# Patient Record
Sex: Female | Born: 1935 | Race: White | Hispanic: No | Marital: Married
Health system: Southern US, Community
[De-identification: ages and names within clinical notes are randomized; demographics above are authoritative.]

---

## 2000-03-09 ENCOUNTER — Encounter: Admission: RE | Admit: 2000-03-09 | Discharge: 2000-03-09 | Payer: Self-pay | Admitting: Cardiology

## 2000-03-09 ENCOUNTER — Encounter: Payer: Self-pay | Admitting: Cardiology

## 2001-03-10 ENCOUNTER — Encounter: Admission: RE | Admit: 2001-03-10 | Discharge: 2001-03-10 | Payer: Self-pay | Admitting: Family Medicine

## 2001-03-10 ENCOUNTER — Encounter: Payer: Self-pay | Admitting: Family Medicine

## 2001-03-21 ENCOUNTER — Encounter: Admission: RE | Admit: 2001-03-21 | Discharge: 2001-03-21 | Payer: Self-pay | Admitting: Family Medicine

## 2001-03-21 ENCOUNTER — Encounter: Payer: Self-pay | Admitting: Family Medicine

## 2002-04-03 ENCOUNTER — Encounter: Admission: RE | Admit: 2002-04-03 | Discharge: 2002-04-03 | Payer: Self-pay | Admitting: Family Medicine

## 2002-04-03 ENCOUNTER — Encounter: Payer: Self-pay | Admitting: Family Medicine

## 2003-04-10 ENCOUNTER — Encounter: Admission: RE | Admit: 2003-04-10 | Discharge: 2003-04-10 | Payer: Self-pay | Admitting: *Deleted

## 2003-04-10 ENCOUNTER — Encounter: Payer: Self-pay | Admitting: *Deleted

## 2004-04-10 ENCOUNTER — Encounter: Admission: RE | Admit: 2004-04-10 | Discharge: 2004-04-10 | Payer: Self-pay | Admitting: *Deleted

## 2005-04-26 ENCOUNTER — Ambulatory Visit: Payer: Self-pay | Admitting: Family Medicine

## 2005-05-07 ENCOUNTER — Ambulatory Visit: Payer: Self-pay | Admitting: Family Medicine

## 2005-08-26 ENCOUNTER — Ambulatory Visit: Payer: Self-pay | Admitting: Rheumatology

## 2006-08-22 ENCOUNTER — Ambulatory Visit: Payer: Self-pay | Admitting: Internal Medicine

## 2007-08-24 ENCOUNTER — Ambulatory Visit: Payer: Self-pay | Admitting: Nurse Practitioner

## 2008-09-23 ENCOUNTER — Ambulatory Visit: Payer: Self-pay | Admitting: Nurse Practitioner

## 2009-05-05 ENCOUNTER — Ambulatory Visit: Payer: Self-pay | Admitting: Internal Medicine

## 2009-05-27 ENCOUNTER — Ambulatory Visit: Payer: Self-pay | Admitting: Internal Medicine

## 2009-06-26 ENCOUNTER — Ambulatory Visit: Payer: Self-pay | Admitting: Internal Medicine

## 2009-07-27 ENCOUNTER — Ambulatory Visit: Payer: Self-pay | Admitting: Internal Medicine

## 2009-09-29 ENCOUNTER — Ambulatory Visit: Payer: Self-pay | Admitting: Nurse Practitioner

## 2010-10-01 ENCOUNTER — Ambulatory Visit: Payer: Self-pay | Admitting: Nurse Practitioner

## 2011-02-09 ENCOUNTER — Ambulatory Visit: Payer: Self-pay | Admitting: Family Medicine

## 2011-10-04 ENCOUNTER — Ambulatory Visit: Payer: Self-pay | Admitting: Family Medicine

## 2011-12-22 ENCOUNTER — Ambulatory Visit: Payer: Self-pay | Admitting: Family Medicine

## 2012-04-21 ENCOUNTER — Ambulatory Visit: Payer: Self-pay | Admitting: Oncology

## 2012-06-14 IMAGING — US ULTRASOUND LEFT BREAST
1 series · 18 of 19 positions shown · non-contrast
Comparison: none

REASON FOR EXAM: LT BRST PAIN NIPPLE AREA
COMMENTS:

PROCEDURE:     US  - US BREAST LEFT  - October 04, 2011  [DATE]
RESULT:     Targeted ultrasound into the periareolar and retroareolar region
of the left breast shows no abnormal fluid collection or solid mass.  No
ductal dilation is evident.

[Series 1: ultrasound left breast · 18 of 19 slices shown]
[im 1/19]
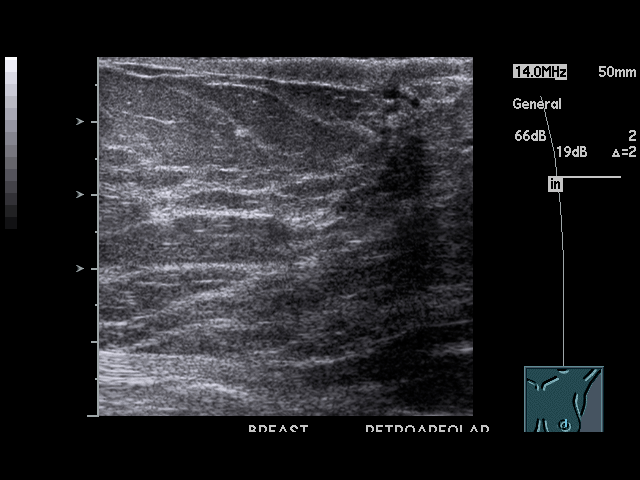
[im 2/19]
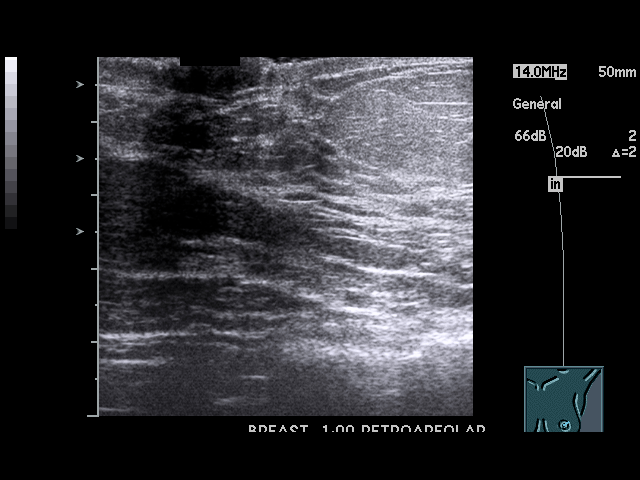
[im 3/19]
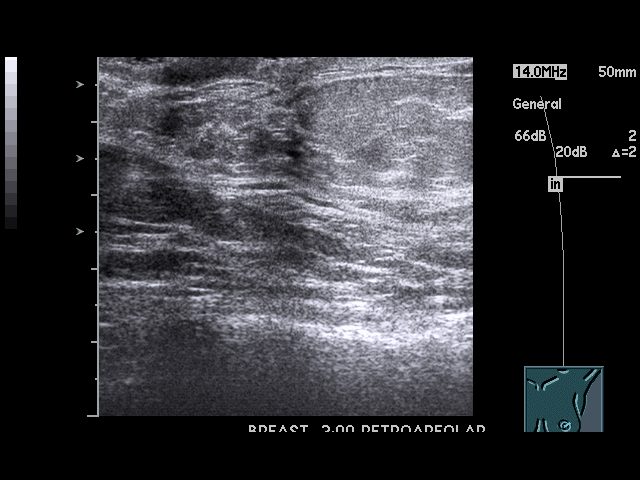
[im 4/19]
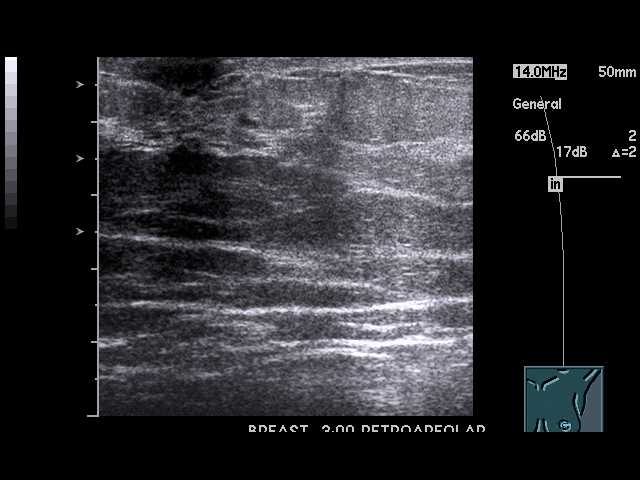
[im 5/19]
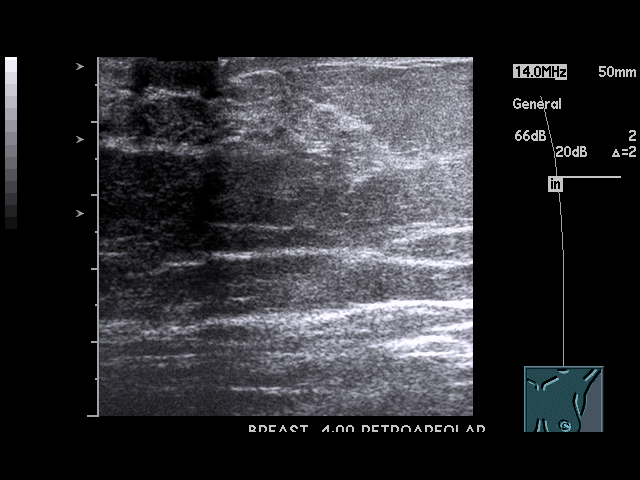
[im 6/19]
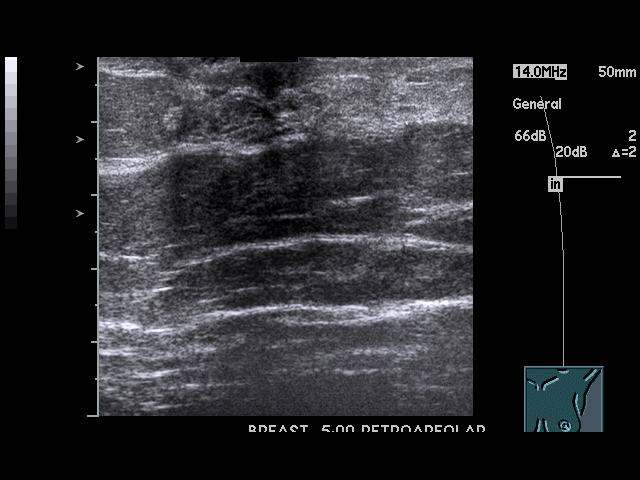
[im 7/19]
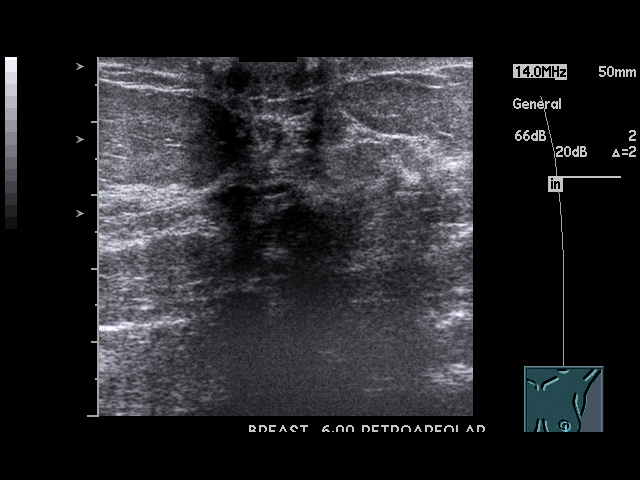
[im 8/19]
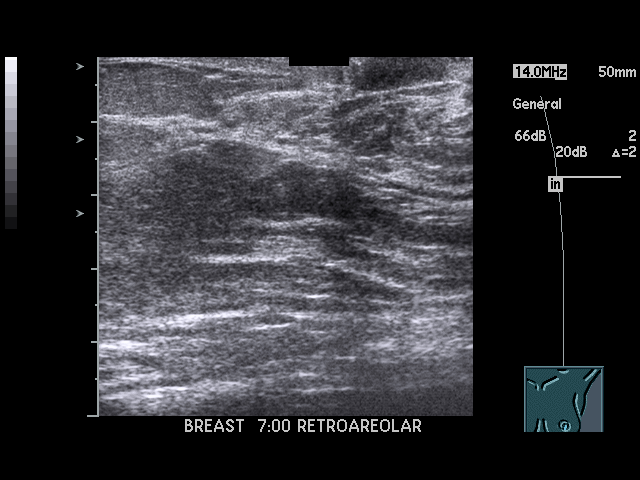
[im 9/19]
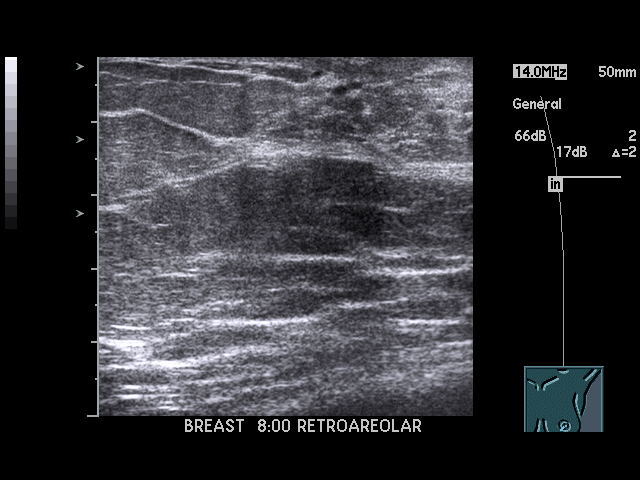
[im 11/19]
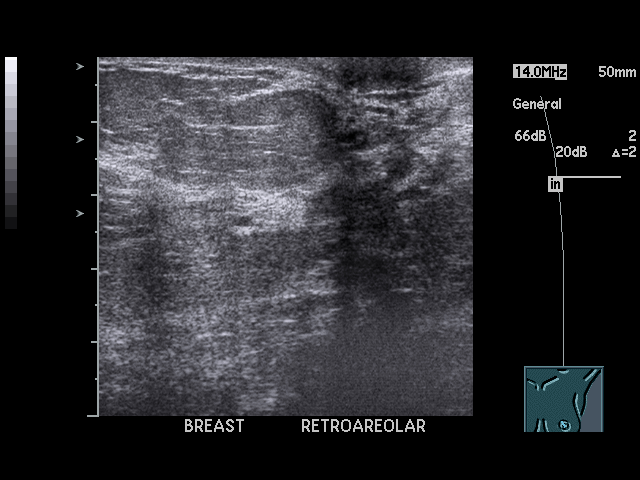
[im 12/19]
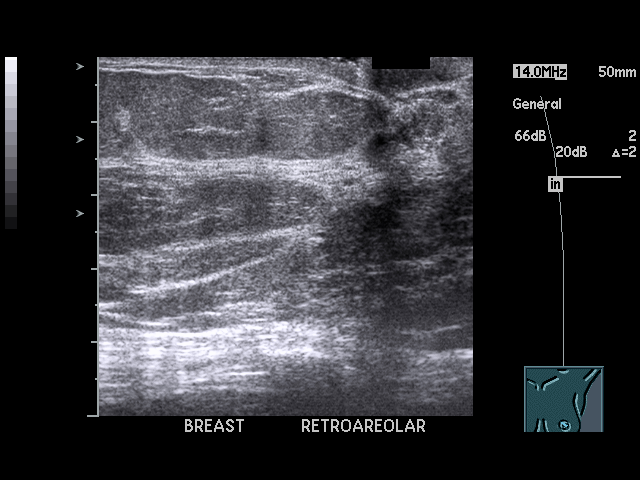
[im 13/19]
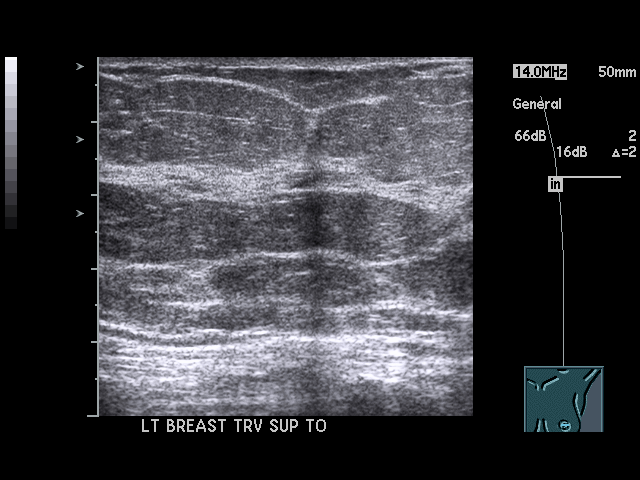
[im 14/19]
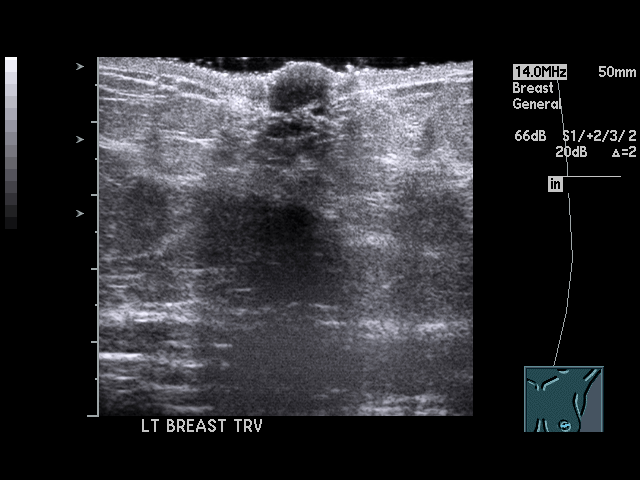
[im 15/19]
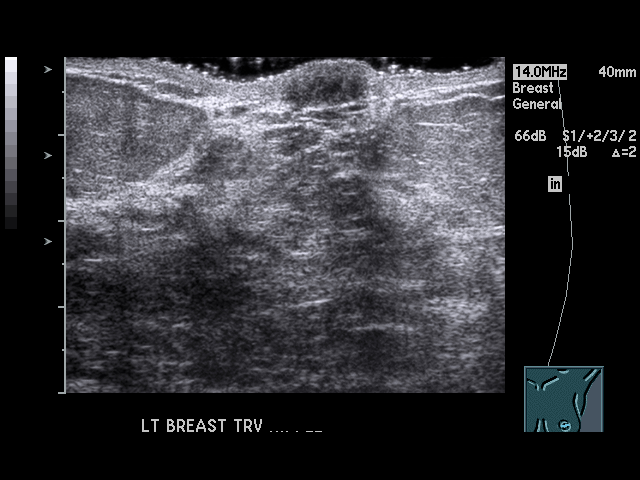
[im 16/19]
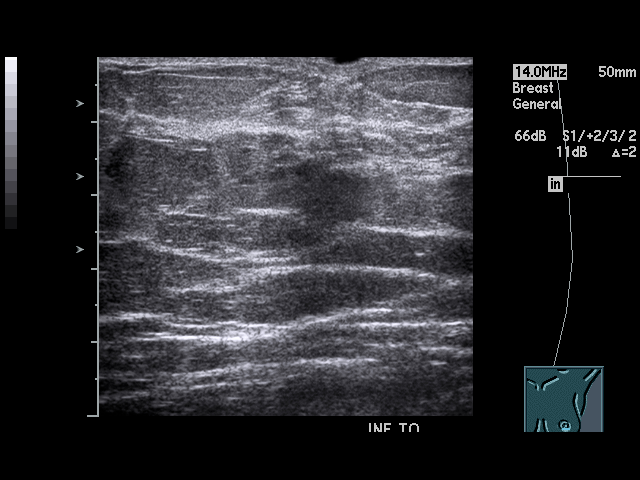
[im 17/19]
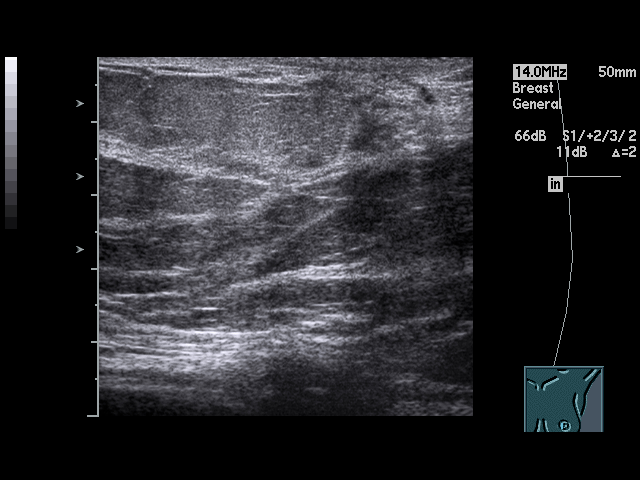
[im 18/19]
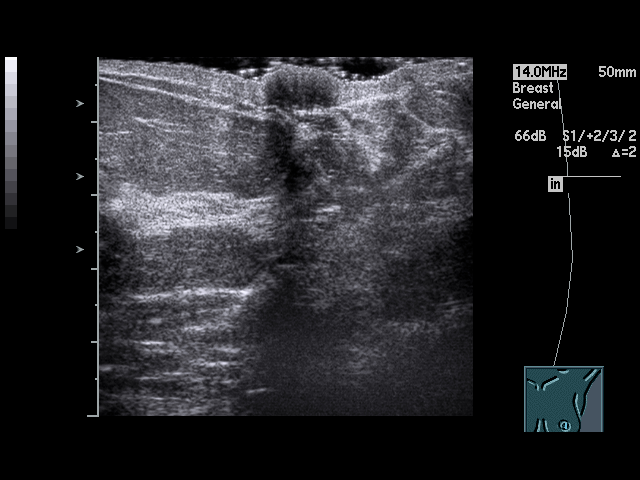
[im 19/19]
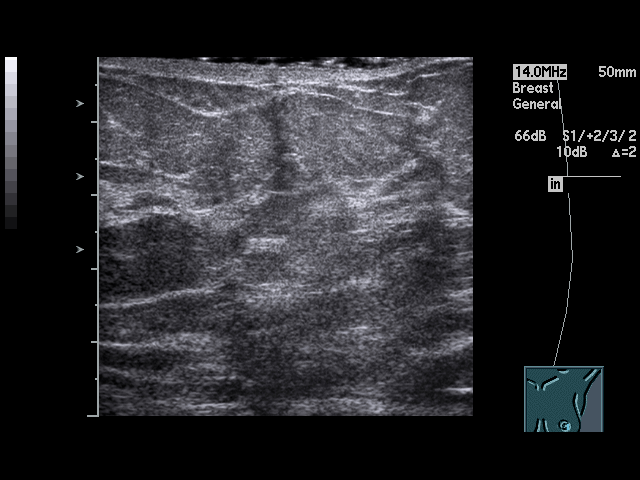

[18 of 19 positions shown; findings below may reference images not displayed]

IMPRESSION: Negative targeted left breast ultrasound.

## 2012-09-01 IMAGING — CT CT HEAD WITHOUT CONTRAST
1 series · 15 of 30 positions shown, 19 images · non-contrast
Comparison: none

REASON FOR EXAM: CR   [id number]  press 2 status post fall hit hefr head
memory loss
COMMENTS:

[Series 2: 1 soft tissue · axial · 0.45mm/px · z∈[-206,-71]mm · 15 of 30 slices shown, 19 images]
[im 2/30  brain]
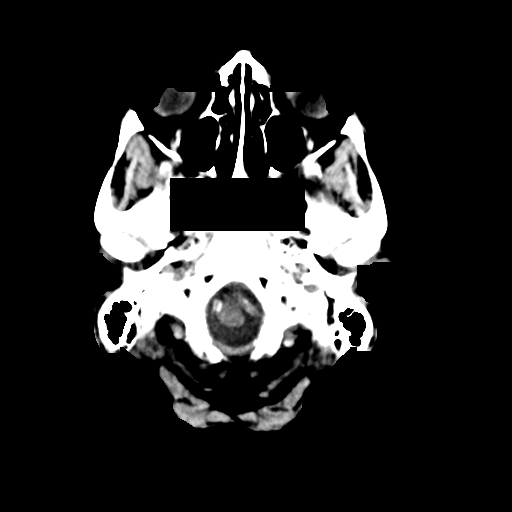
[im 2/30  bone]
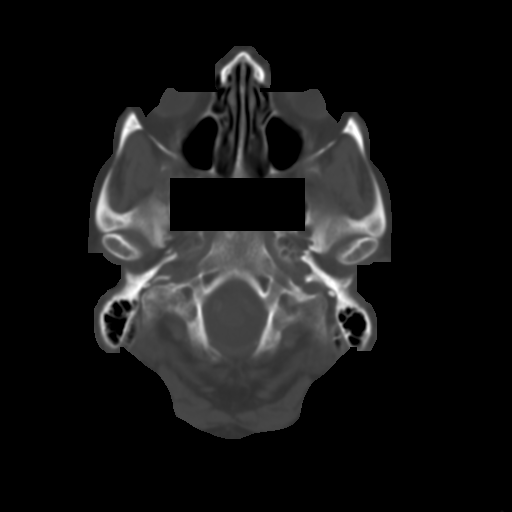
[im 4/30  brain]
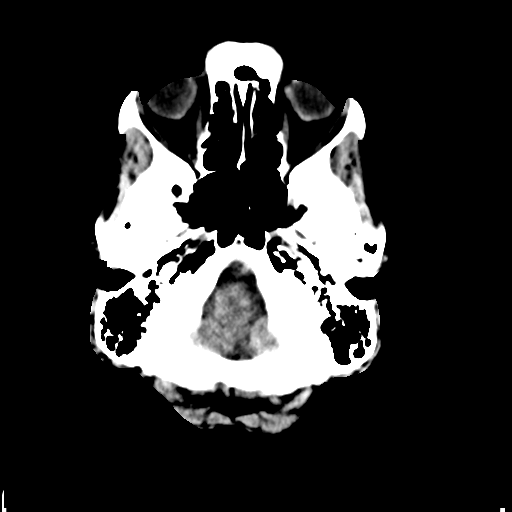
[im 6/30  brain]
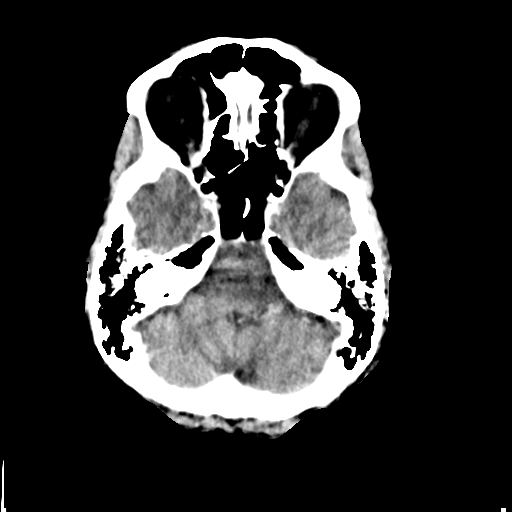
[im 8/30  brain]
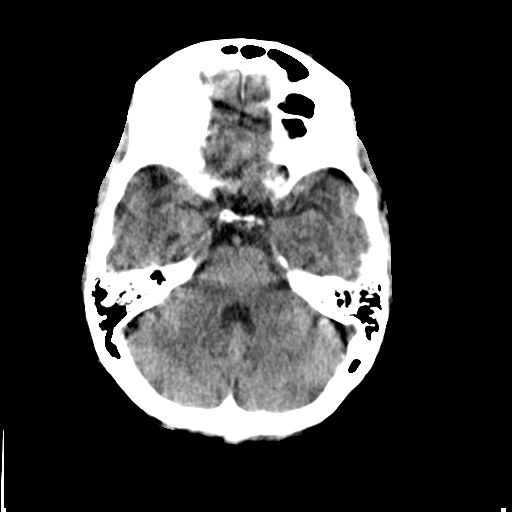
[im 10/30  brain]
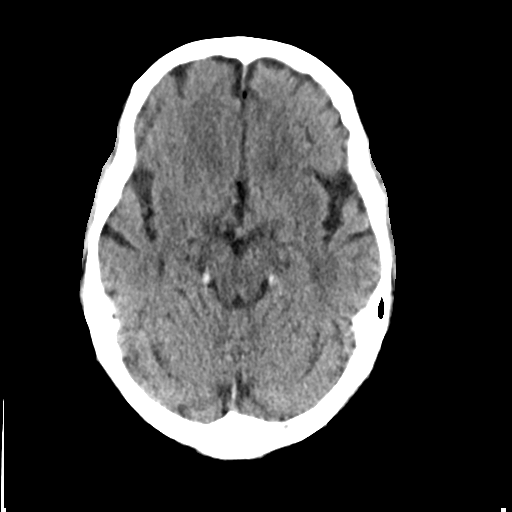
[im 10/30  bone]
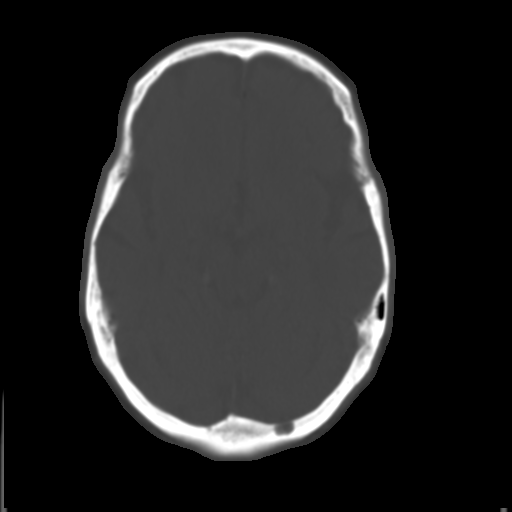
[im 12/30  brain]
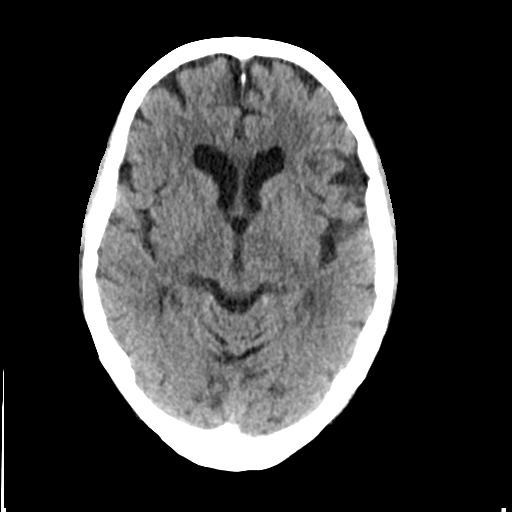
[im 14/30  brain]
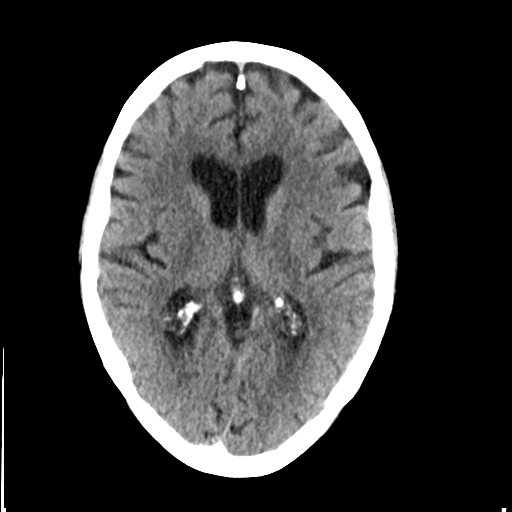
[im 16/30  brain]
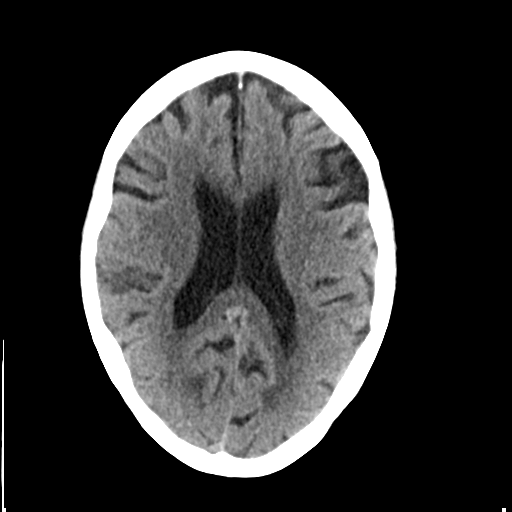
[im 17/30  brain]
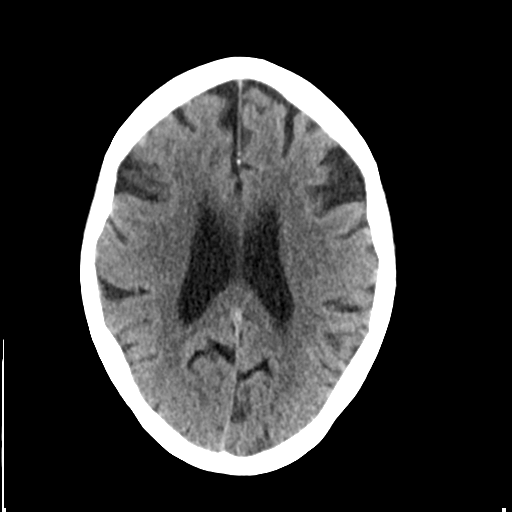
[im 17/30  bone]
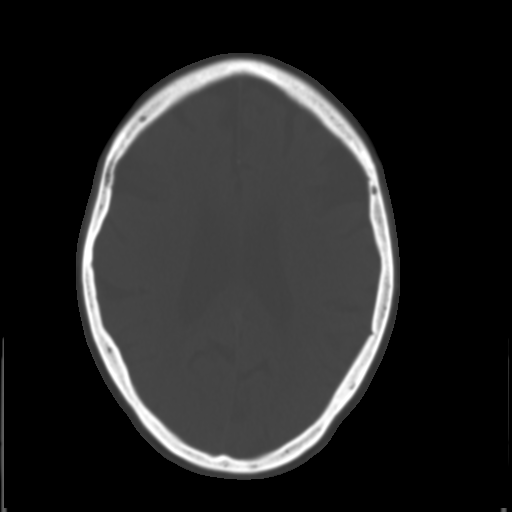
[im 19/30  brain]
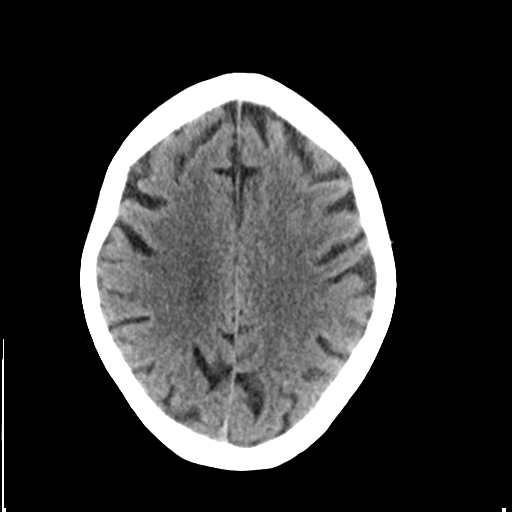
[im 21/30  brain]
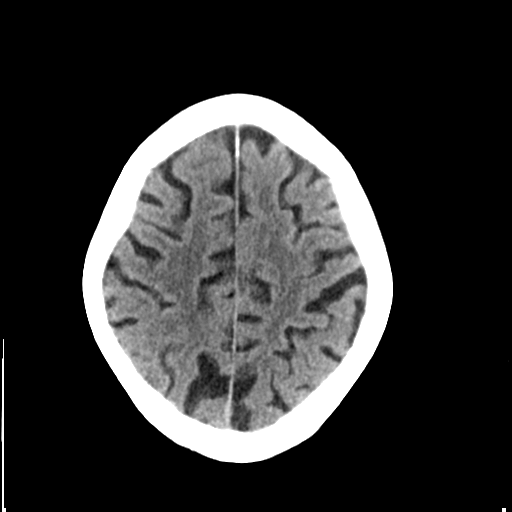
[im 23/30  brain]
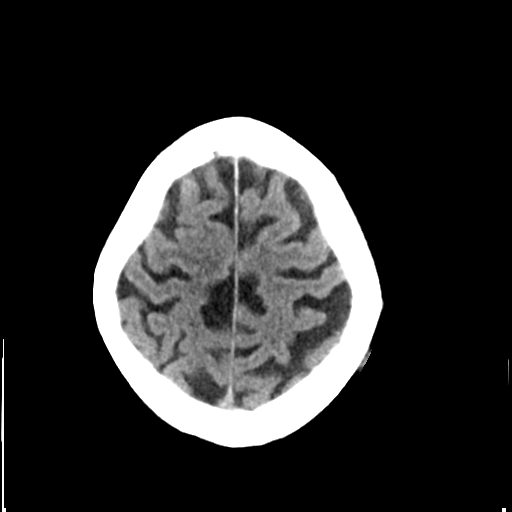
[im 25/30  brain]
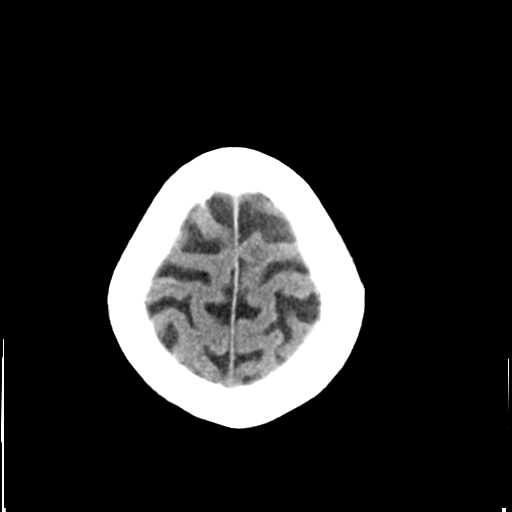
[im 25/30  bone]
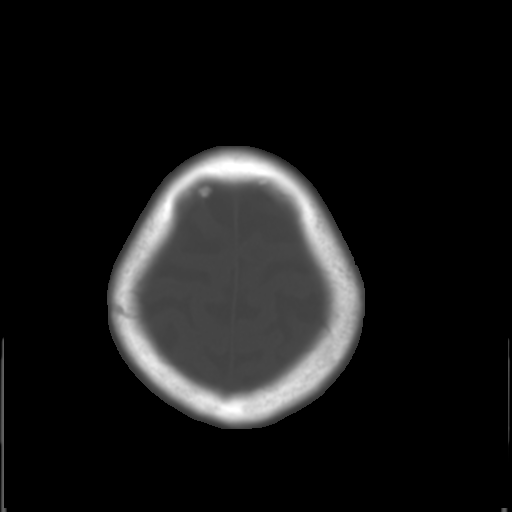
[im 27/30  brain]
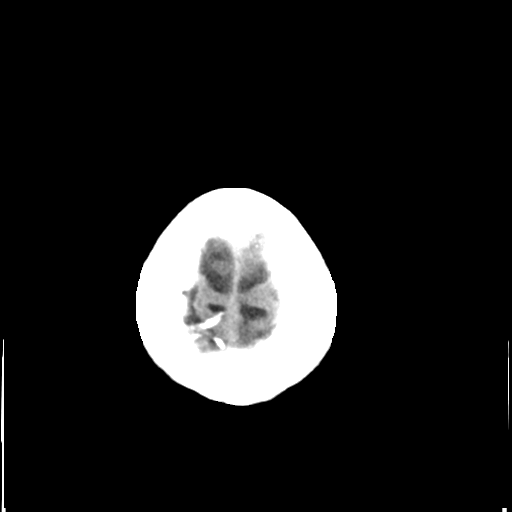
[im 29/30  brain]
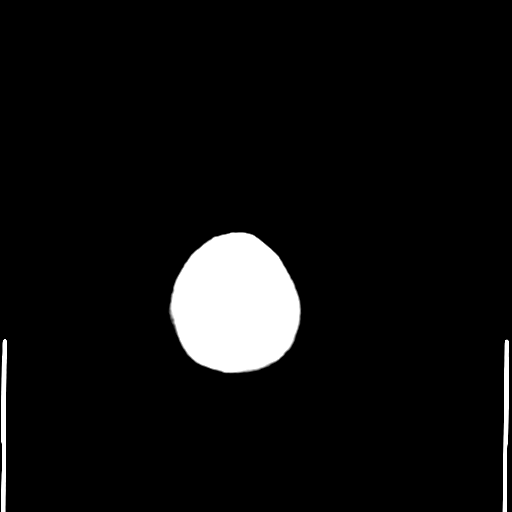

[15 of 30 positions shown; findings below may reference images not displayed]

PROCEDURE:     KCT - KCT HEAD WITHOUT CONTRAST  - December 22, 2011  [DATE]

RESULT:     Axial noncontrast CT scanning was performed through the brain
with reconstructions at 5 mm intervals and slice thicknesses.

There is mild diffuse cerebral and cerebellar atrophy with compensatory
ventriculomegaly. There is no evidence of an acute or subacute or old
subdural hemorrhage. There is no shift of the midline. There is no evidence
of an acute intracranial hemorrhage. The cerebellum and brainstem exhibit no
acute abnormality. At bone window settings the observed portions of the
paranasal sinuses and mastoid air cells are clear. There is no evidence of
an acute skull fracture.
IMPRESSION: There are age-related changes present. I do not see
evidence of an acute ischemic or hemorrhagic event. Specifically there are
no findings to suggest a subdural hemorrhage.

Attempts were made to contact Dr. [REDACTED] at the conclusion of the
study but I could not get through.

## 2012-10-26 ENCOUNTER — Ambulatory Visit: Payer: Self-pay | Admitting: Family Medicine

## 2015-03-28 ENCOUNTER — Ambulatory Visit: Payer: Self-pay

## 2020-08-21 ENCOUNTER — Other Ambulatory Visit: Payer: Self-pay | Admitting: Oncology

## 2020-08-21 NOTE — Progress Notes (Signed)
RE: Mab infusion  Called to Discuss with patient about Covid symptoms and the use of the monoclonal antibody infusion for those with mild to moderate Covid symptoms and at a high risk of hospitalization.     Pt is qualified for this infusion due to co-morbid conditions and/or a member of an at-risk group.     There are no problems to display for this patient.   Patient declines infusion at this time. Symptoms tier reviewed as well as criteria for ending isolation. Preventative practices reviewed. Patient verbalized understanding.    Patient advised to call back if he/she decides that he/she does want to get infusion. Callback number to the infusion center given. Patient advised to go to Urgent care or ED with severe symptoms.   Durenda Hurt, NP 08/21/2020 9:50 AM

## 2020-09-26 DEATH — deceased
